# Patient Record
Sex: Male | Born: 1980 | Race: White | Hispanic: No | Marital: Single | Smoking: Current every day smoker
Health system: Southern US, Community
[De-identification: ages and names within clinical notes are randomized; demographics above are authoritative.]

## PROBLEM LIST (undated history)

## (undated) DIAGNOSIS — K219 Gastro-esophageal reflux disease without esophagitis: Secondary | ICD-10-CM

## (undated) DIAGNOSIS — Z8614 Personal history of Methicillin resistant Staphylococcus aureus infection: Secondary | ICD-10-CM

## (undated) DIAGNOSIS — K449 Diaphragmatic hernia without obstruction or gangrene: Secondary | ICD-10-CM

## (undated) HISTORY — PX: MANDIBLE SURGERY: SHX707

---

## 2002-10-26 ENCOUNTER — Encounter: Payer: Self-pay | Admitting: Emergency Medicine

## 2002-10-26 ENCOUNTER — Emergency Department (HOSPITAL_COMMUNITY): Admission: EM | Admit: 2002-10-26 | Discharge: 2002-10-26 | Payer: Self-pay | Admitting: Emergency Medicine

## 2002-12-28 ENCOUNTER — Encounter: Payer: Self-pay | Admitting: Emergency Medicine

## 2002-12-28 ENCOUNTER — Emergency Department (HOSPITAL_COMMUNITY): Admission: EM | Admit: 2002-12-28 | Discharge: 2002-12-28 | Payer: Self-pay | Admitting: Emergency Medicine

## 2004-05-08 ENCOUNTER — Emergency Department (HOSPITAL_COMMUNITY): Admission: EM | Admit: 2004-05-08 | Discharge: 2004-05-08 | Payer: Self-pay | Admitting: Emergency Medicine

## 2004-12-21 ENCOUNTER — Ambulatory Visit: Payer: Self-pay | Admitting: Family Medicine

## 2004-12-25 ENCOUNTER — Ambulatory Visit (HOSPITAL_COMMUNITY): Admission: RE | Admit: 2004-12-25 | Discharge: 2004-12-25 | Payer: Self-pay | Admitting: Internal Medicine

## 2005-01-01 ENCOUNTER — Ambulatory Visit: Payer: Self-pay | Admitting: Family Medicine

## 2005-01-03 ENCOUNTER — Ambulatory Visit (HOSPITAL_COMMUNITY): Admission: RE | Admit: 2005-01-03 | Discharge: 2005-01-03 | Payer: Self-pay | Admitting: Internal Medicine

## 2005-01-15 ENCOUNTER — Ambulatory Visit: Payer: Self-pay | Admitting: Family Medicine

## 2005-02-12 ENCOUNTER — Emergency Department (HOSPITAL_COMMUNITY): Admission: EM | Admit: 2005-02-12 | Discharge: 2005-02-12 | Payer: Self-pay | Admitting: Emergency Medicine

## 2006-04-19 ENCOUNTER — Emergency Department (HOSPITAL_COMMUNITY): Admission: EM | Admit: 2006-04-19 | Discharge: 2006-04-19 | Payer: Self-pay | Admitting: Emergency Medicine

## 2006-05-05 ENCOUNTER — Emergency Department (HOSPITAL_COMMUNITY): Admission: EM | Admit: 2006-05-05 | Discharge: 2006-05-05 | Payer: Self-pay | Admitting: Emergency Medicine

## 2006-05-07 ENCOUNTER — Ambulatory Visit (HOSPITAL_BASED_OUTPATIENT_CLINIC_OR_DEPARTMENT_OTHER): Admission: RE | Admit: 2006-05-07 | Discharge: 2006-05-07 | Payer: Self-pay | Admitting: Otolaryngology

## 2006-05-22 ENCOUNTER — Ambulatory Visit (HOSPITAL_COMMUNITY): Admission: RE | Admit: 2006-05-22 | Discharge: 2006-05-22 | Payer: Self-pay | Admitting: Otolaryngology

## 2006-05-23 ENCOUNTER — Ambulatory Visit (HOSPITAL_BASED_OUTPATIENT_CLINIC_OR_DEPARTMENT_OTHER): Admission: RE | Admit: 2006-05-23 | Discharge: 2006-05-23 | Payer: Self-pay | Admitting: Otolaryngology

## 2006-06-14 ENCOUNTER — Ambulatory Visit (HOSPITAL_BASED_OUTPATIENT_CLINIC_OR_DEPARTMENT_OTHER): Admission: RE | Admit: 2006-06-14 | Discharge: 2006-06-14 | Payer: Self-pay | Admitting: Otolaryngology

## 2006-07-05 ENCOUNTER — Ambulatory Visit (HOSPITAL_BASED_OUTPATIENT_CLINIC_OR_DEPARTMENT_OTHER): Admission: RE | Admit: 2006-07-05 | Discharge: 2006-07-05 | Payer: Self-pay | Admitting: Otolaryngology

## 2006-10-02 ENCOUNTER — Emergency Department (HOSPITAL_COMMUNITY): Admission: EM | Admit: 2006-10-02 | Discharge: 2006-10-02 | Payer: Self-pay | Admitting: Emergency Medicine

## 2008-10-16 ENCOUNTER — Emergency Department (HOSPITAL_COMMUNITY): Admission: EM | Admit: 2008-10-16 | Discharge: 2008-10-16 | Payer: Self-pay | Admitting: Emergency Medicine

## 2011-03-30 NOTE — Op Note (Signed)
[name], [name]               ACCOUNT NO.:  [id number]   MEDICAL RECORD NO.:  [id number]          PATIENT TYPE:  AMB   LOCATION:  DSC                          FACILITY:  [facility]   PHYSICIAN:  Lucky Cowboy, MD         DATE OF BIRTH:  1981/08/31   DATE OF PROCEDURE:  06/14/2006  DATE OF DISCHARGE:  06/14/2006                                 OPERATIVE REPORT   PREOPERATIVE DIAGNOSIS:  Mandible fracture.   POSTOPERATIVE DIAGNOSIS:  Mandible fracture.   PROCEDURE:  Maxillomandibular fixation.   SURGEON:  Lucky Cowboy, MD   ANESTHESIA:  General.   ESTIMATED BLOOD LOSS:  Less than 20 cc.   SPECIMENS:  None.   COMPLICATIONS:  None.   INDICATIONS:  The patient is a 30 year old male who underwent  maxillomandibular fixation and open reduction internal fixation of mandible  fractures; approximately 2 nights and 3 weeks ago.  He did relatively well  initially, but reports he developed nausea and was concerned about vomiting.  For this reason, he cut himself out of occlusion.  He is returned today for  replacement of maxillomandibular fixation.   FINDINGS:  The patient was noted to require tightening of the bolt; however,  he went into occlusion __________ .   PROCEDURE:  The patient was taken to the operating room and placed on the  table in the supine position.  He was then transnasally fiberoptically  intubated.  Once he was put to sleep, 1% lidocaine with 1:100,000 of  epinephrine was used to inject the area around the existing fore bolt.  Bovie cautery was used to open the tissue overlying the bolt, which had been  closed over.  A screwdriver was used to tighten down the bolt.  A 22 gauge  wire was then used to form a crisscross pattern, which held the patient in  class I occlusion.  The molars were occluding along their wire facet.  An NG  tube was used to suction the gastric contents.  The patient was awakened  from the anesthesia and taken to the Post Anesthesia Care Unit in  stable  condition.  There were no complications.      Lucky Cowboy, MD  Electronically Signed     SJ/MEDQ  D:  08/08/2006  T:  08/10/2006  Job:  [id number]
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
## 2011-03-30 NOTE — Op Note (Signed)
[name], Roberto Myers               ACCOUNT NO.:  [id number]   MEDICAL RECORD NO.:  [id number]          PATIENT TYPE:  AMB   LOCATION:  DSC                          FACILITY:  [facility]   PHYSICIAN:  Lucky Cowboy, MD         DATE OF BIRTH:  1980-12-22   DATE OF PROCEDURE:  05/23/2006  DATE OF DISCHARGE:                                 OPERATIVE REPORT   PREOPERATIVE DIAGNOSIS:  Left angle mandibular fracture, nondisplaced.   POSTOPERATIVE DIAGNOSIS:  Left angle mandibular fracture, nondisplaced.   PROCEDURE:  Replacement of maxillomandibular wires.   SURGEON:  Dr. Lucky Cowboy.   ANESTHESIA:  General endotracheal anesthesia.   ESTIMATED BLOOD LOSS:  Less than 20 mL.   SPECIMENS:  None.   COMPLICATIONS:  None.   INDICATIONS:  The patient is a 30 year old male underwent placement of  maxillomandibular fixation for closed reduction of a nondisplaced left  mandibular fracture about two and a half weeks ago.  Four days ago, he  experiences nausea and was taken anticipating vomiting and for this reason,  he cut his wires.  They have to be replaced today.  The patient did have a  Panorex yesterday which revealed excellent alignment of the fracture.   PROCEDURE:  The patient was taken to the operating room and placed on the  table in the supine position.  He was then placed under general anesthesia  through nasotracheal intubation.  1% lidocaine with 1:100,000 epinephrine  was used to inject the in the gingiva and upper lip along mucosal services  in the area of the four bolt system.  The cut wires were removed.  The Bovie  was used on the cutting mode to expose the bolts.  They were screwed down  close to the skin and were all four stable after doing this maneuver.  22-  gauge wires were then placed in a crisscross fashion to connect two bolts  together.  This resulted in excellent stabilization and class 2 occlusion  with contact of all the wear facets.  The incisions were closed in a  running  locking stitch using 4-0 Vicryl.  Bone wax was placed on the wires.  The  patient was awakened from anesthesia and extubated in the operating room.  He was taken to the Post Anesthesia Care Unit in stable condition.  There  were no complications.      Lucky Cowboy, MD  Electronically Signed     SJ/MEDQ  D:  05/23/2006  T:  05/23/2006  Job:  [id number]
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
## 2011-08-17 LAB — CBC
Hemoglobin: 14.8 g/dL (ref 13.0–17.0)
MCV: 95.9 fL (ref 78.0–100.0)
Platelets: 265 10*3/uL (ref 150–400)
RBC: 4.76 MIL/uL (ref 4.22–5.81)
RDW: 13 % (ref 11.5–15.5)

## 2011-08-17 LAB — RAPID URINE DRUG SCREEN, HOSP PERFORMED
Amphetamines: NOT DETECTED
Barbiturates: NOT DETECTED
Opiates: POSITIVE — AB
Tetrahydrocannabinol: POSITIVE — AB

## 2011-08-17 LAB — BASIC METABOLIC PANEL
Chloride: 102 mEq/L (ref 96–112)
GFR calc Af Amer: >60 mL/min (ref >60)
Glucose, Bld: 88 mg/dL (ref 70–99)
Potassium: 4 mEq/L (ref 3.5–5.1)

## 2011-08-17 LAB — DIFFERENTIAL
Basophils Absolute: 0.1 10*3/uL (ref 0.0–0.1)
Basophils Relative: 1 % (ref 0–1)
Eosinophils Absolute: 1.6 10*3/uL — ABNORMAL HIGH (ref 0.0–0.7)
Eosinophils Relative: 13 % — ABNORMAL HIGH (ref 0–5)
Lymphocytes Relative: 11 % — ABNORMAL LOW (ref 12–46)
Lymphs Abs: 1.3 10*3/uL (ref 0.7–4.0)
Monocytes Absolute: 0.6 10*3/uL (ref 0.1–1.0)

## 2011-08-17 LAB — ETHANOL: Alcohol, Ethyl (B): <5 mg/dL (ref 0–10)

## 2014-03-15 ENCOUNTER — Encounter (HOSPITAL_BASED_OUTPATIENT_CLINIC_OR_DEPARTMENT_OTHER): Payer: Self-pay | Admitting: Emergency Medicine

## 2014-03-15 ENCOUNTER — Emergency Department (HOSPITAL_BASED_OUTPATIENT_CLINIC_OR_DEPARTMENT_OTHER): Payer: BC Managed Care – PPO

## 2014-03-15 ENCOUNTER — Emergency Department (HOSPITAL_BASED_OUTPATIENT_CLINIC_OR_DEPARTMENT_OTHER)
Admission: EM | Admit: 2014-03-15 | Discharge: 2014-03-15 | Disposition: A | Discharge status: Home / Self Care | Payer: BC Managed Care – PPO | Attending: Emergency Medicine | Admitting: Emergency Medicine

## 2014-03-15 DIAGNOSIS — M549 Dorsalgia, unspecified: Secondary | ICD-10-CM | POA: Insufficient documentation

## 2014-03-15 DIAGNOSIS — R109 Unspecified abdominal pain: Secondary | ICD-10-CM

## 2014-03-15 DIAGNOSIS — K7689 Other specified diseases of liver: Secondary | ICD-10-CM | POA: Insufficient documentation

## 2014-03-15 DIAGNOSIS — R1012 Left upper quadrant pain: Secondary | ICD-10-CM | POA: Insufficient documentation

## 2014-03-15 DIAGNOSIS — Z8614 Personal history of Methicillin resistant Staphylococcus aureus infection: Secondary | ICD-10-CM | POA: Insufficient documentation

## 2014-03-15 DIAGNOSIS — R1032 Left lower quadrant pain: Secondary | ICD-10-CM | POA: Insufficient documentation

## 2014-03-15 DIAGNOSIS — K769 Liver disease, unspecified: Secondary | ICD-10-CM

## 2014-03-15 HISTORY — DX: Personal history of Methicillin resistant Staphylococcus aureus infection: Z86.14

## 2014-03-15 HISTORY — DX: Diaphragmatic hernia without obstruction or gangrene: K44.9

## 2014-03-15 HISTORY — DX: Gastro-esophageal reflux disease without esophagitis: K21.9

## 2014-03-15 LAB — URINALYSIS, ROUTINE W REFLEX MICROSCOPIC
BILIRUBIN URINE: NEGATIVE
GLUCOSE, UA: NEGATIVE mg/dL
HGB URINE DIPSTICK: NEGATIVE
Ketones, ur: NEGATIVE mg/dL
Leukocytes, UA: NEGATIVE
Nitrite: NEGATIVE
Protein, ur: NEGATIVE mg/dL
SPECIFIC GRAVITY, URINE: 1.021 (ref 1.005–1.030)
Urobilinogen, UA: 0.2 mg/dL (ref 0.0–1.0)
pH: 5.5 (ref 5.0–8.0)

## 2014-03-15 LAB — COMPREHENSIVE METABOLIC PANEL
ALBUMIN: 4.6 g/dL (ref 3.5–5.2)
ALT: 30 U/L (ref 0–53)
AST: 25 U/L (ref 0–37)
Alkaline Phosphatase: 56 U/L (ref 39–117)
BILIRUBIN TOTAL: 0.3 mg/dL (ref 0.3–1.2)
BUN: 18 mg/dL (ref 6–23)
CALCIUM: 9.4 mg/dL (ref 8.4–10.5)
CHLORIDE: 101 meq/L (ref 96–112)
CO2: 21 meq/L (ref 19–32)
Creatinine, Ser: 1.1 mg/dL (ref 0.50–1.35)
GFR calc Af Amer: >90 mL/min (ref >90)
GFR calc non Af Amer: 86 mL/min — ABNORMAL LOW (ref >90)
GLUCOSE: 103 mg/dL — AB (ref 70–99)
POTASSIUM: 3.8 meq/L (ref 3.7–5.3)
SODIUM: 140 meq/L (ref 137–147)
TOTAL PROTEIN: 7.6 g/dL (ref 6.0–8.3)

## 2014-03-15 LAB — CBC WITH DIFFERENTIAL/PLATELET
BASOS PCT: 1 % (ref 0–1)
Basophils Absolute: 0.1 10*3/uL (ref 0.0–0.1)
EOS ABS: 2.2 10*3/uL — AB (ref 0.0–0.7)
EOS PCT: 22 % — AB (ref 0–5)
HCT: 44.9 % (ref 39.0–52.0)
HEMOGLOBIN: 15.8 g/dL (ref 13.0–17.0)
LYMPHS ABS: 2.9 10*3/uL (ref 0.7–4.0)
Lymphocytes Relative: 30 % (ref 12–46)
MCH: 32.8 pg (ref 26.0–34.0)
MCHC: 35.2 g/dL (ref 30.0–36.0)
MCV: 93.3 fL (ref 78.0–100.0)
Monocytes Absolute: 0.6 10*3/uL (ref 0.1–1.0)
Monocytes Relative: 6 % (ref 3–12)
NEUTROS ABS: 4 10*3/uL (ref 1.7–7.7)
NEUTROS PCT: 41 % — AB (ref 43–77)
Platelets: 272 10*3/uL (ref 150–400)
RBC: 4.81 MIL/uL (ref 4.22–5.81)
RDW: 12.7 % (ref 11.5–15.5)
WBC: 9.7 10*3/uL (ref 4.0–10.5)

## 2014-03-15 LAB — LIPASE, BLOOD: Lipase: 21 U/L (ref 11–59)

## 2014-03-15 MED ORDER — HYDROCODONE-ACETAMINOPHEN 5-325 MG PO TABS: 1.0000 | ORAL_TABLET | ORAL | Status: AC | PRN

## 2014-03-15 MED ORDER — IOHEXOL 300 MG/ML  SOLN
50.0000 mL | Freq: Once | INTRAMUSCULAR | Status: AC | PRN
  Administered 2014-03-15: 50 mL via ORAL

## 2014-03-15 MED ORDER — IOHEXOL 300 MG/ML  SOLN
100.0000 mL | Freq: Once | INTRAMUSCULAR | Status: AC | PRN
  Administered 2014-03-15: 100 mL via INTRAVENOUS

## 2014-03-15 MED ORDER — ONDANSETRON HCL 4 MG/2ML IJ SOLN
4.0000 mg | Freq: Once | INTRAMUSCULAR | Status: AC
  Administered 2014-03-15: 4 mg via INTRAVENOUS
  Filled 2014-03-15: qty 2

## 2014-03-15 MED ORDER — MORPHINE SULFATE 4 MG/ML IJ SOLN
4.0000 mg | Freq: Once | INTRAMUSCULAR | Status: AC
  Administered 2014-03-15: 4 mg via INTRAVENOUS
  Filled 2014-03-15: qty 1

## 2014-03-15 NOTE — ED Provider Notes (Signed)
CSN: [id number]     Arrival date & time 03/15/14  1138 History   First MD Initiated Contact with Patient 03/15/14 1140     Chief Complaint  Patient presents with  . Abdominal Pain  . Back Pain     (Consider location/radiation/quality/duration/timing/severity/associated sxs/prior Treatment) HPI Comments: Pt states that he has been having change in the texture of stool no diarrhea or bloody stools. No fever. Pt states that the pain is constant but at times it gets more intense  Patient is a 33 y.o. male presenting with abdominal pain and back pain. The history is provided by the patient.  Abdominal Pain Pain location:  L flank and LLQ Pain quality: aching and pressure   Pain severity:  Moderate Onset quality:  Gradual Duration:  2 weeks Timing:  Constant Progression:  Unchanged Context: not alcohol use, not previous surgeries and not retching   Worsened by:  Nothing tried Back Pain Associated symptoms: abdominal pain     Past Medical History  Diagnosis Date  . Hx MRSA infection    No past surgical history on file. No family history on file. History  Substance Use Topics  . Smoking status: Not on file  . Smokeless tobacco: Not on file  . Alcohol Use: Not on file    Review of Systems  Gastrointestinal: Positive for abdominal pain.  Musculoskeletal: Positive for back pain.      Allergies  Review of patient's allergies indicates not on file.  Home Medications   Prior to Admission medications   Not on File   BP 145/96  Pulse 106  Temp(Src) 98.7 F (37.1 C) (Oral)  Resp 20  Ht 5\' 10"  (1.778 m)  Wt 195 lb (88.451 kg)  BMI 27.98 kg/m2  SpO2 99% Physical Exam  Nursing note and vitals reviewed. Constitutional: He is oriented to person, place, and time.  Cardiovascular: Normal rate and regular rhythm.   Pulmonary/Chest: Effort normal and breath sounds normal.  Abdominal: Soft. There is tenderness in the left upper quadrant and left lower quadrant.   Musculoskeletal: Normal range of motion.  Neurological: He is alert and oriented to person, place, and time. Coordination normal.  Skin: Skin is warm and dry.    ED Course  Procedures (including critical care time) Labs Review Labs Reviewed  COMPREHENSIVE METABOLIC PANEL - Abnormal; Notable for the following:    Glucose, Bld 103 (*)    GFR calc non Af Amer 86 (*)    All other components within normal limits  CBC WITH DIFFERENTIAL - Abnormal; Notable for the following:    Neutrophils Relative % 41 (*)    Eosinophils Relative 22 (*)    Eosinophils Absolute 2.2 (*)    All other components within normal limits  LIPASE, BLOOD  URINALYSIS, ROUTINE W REFLEX MICROSCOPIC    Imaging Review Ct Abdomen Pelvis W Contrast  03/15/2014   CLINICAL DATA:  Left-sided abdominal pain, low back pain.  EXAM: CT ABDOMEN AND PELVIS WITH CONTRAST  TECHNIQUE: Multidetector CT imaging of the abdomen and pelvis was performed using the standard protocol following bolus administration of intravenous contrast.  CONTRAST:  50mL OMNIPAQUE IOHEXOL 300 MG/ML SOLN, 100mL OMNIPAQUE IOHEXOL 300 MG/ML SOLN  COMPARISON:  CT ABDOMEN W/O CM dated 12/25/2004  FINDINGS: Lung bases show no acute findings. Heart size normal. No pericardial or pleural effusion.  3 mm low-attenuation lesion in the right hepatic lobe is too small to characterize. Liver, gallbladder, adrenal glands, kidneys, spleen, pancreas, stomach and bowel are otherwise  unremarkable. Prostate is normal in size. There is a calcification in the soft tissues posterior to the symphysis pubis (series 2, image 85), measuring 4 mm, which appears more prominent than on 12/25/2004 and may represent a prostatic calcification. Bladder is grossly unremarkable. No pathologically enlarged lymph nodes. No free fluid. 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  IMPRESSION: 1. No findings to explain the patient's given symptoms. 2. Atherosclerotic  calcification of the arterial vasculature appears age advanced.   Electronically Signed   By: Leanna BattlesMelinda  Blietz M.D.   On: 03/15/2014 13:42     EKG Interpretation None      MDM   Final diagnoses:  Abdominal pain  Flank pain  Liver lesion, right lobe    No acute findings:discussed ct findings with pt and discussed the need to follow up on liver findings. Will send home with resource guide and pain medication    Teressa LowerVrinda Jilliana Burkes, NP 03/15/14 1408

## 2014-03-15 NOTE — ED Provider Notes (Signed)
Medical screening examination/treatment/procedure(s) were performed by non-physician practitioner and as supervising physician I was immediately available for consultation/collaboration.     Estephan Gallardo, MD 03/15/14 1423 

## 2014-03-15 NOTE — ED Notes (Signed)
Pt. Stated pain in LUQ that radiates to L flank x2 weeks.

## 2014-03-15 NOTE — Discharge Instructions (Signed)
As discussed you need to follow up with your doctor to evaluate the liver further Abdominal Pain, Adult Many things can cause belly (abdominal) pain. Most times, the belly pain is not dangerous. Many cases of belly pain can be watched and treated at home. HOME CARE   Do not take medicines that help you go poop (laxatives) unless told to by your doctor.  Only take medicine as told by your doctor.  Eat or drink as told by your doctor. Your doctor will tell you if you should be on a special diet. GET HELP IF:  You do not know what is causing your belly pain.  You have belly pain while you are sick to your stomach (nauseous) or have runny poop (diarrhea).  You have pain while you pee or poop.  Your belly pain wakes you up at night.  You have belly pain that gets worse or better when you eat.  You have belly pain that gets worse when you eat fatty foods. GET HELP RIGHT AWAY IF:   The pain does not go away within 2 hours.  You have a fever.  You keep throwing up (vomiting).  The pain changes and is only in the right or left part of the belly.  You have bloody or tarry looking poop. MAKE SURE YOU:   Understand these instructions.  Will watch your condition.  Will get help right away if you are not doing well or get worse. Document Released: 04/16/2008 Document Revised: 08/19/2013 Document Reviewed: 07/08/2013 [facility] Patient Information 2014 [employer], [place].  Emergency Department Resource Guide 1) Find a Doctor and Pay Out of Pocket Although you won't have to find out who is covered by your insurance plan, it is a good idea to ask around and get recommendations. You will then need to call the office and see if the doctor you have chosen will accept you as a new patient and what types of options they offer for patients who are self-pay. Some doctors offer discounts or will set up payment plans for their patients who do not have insurance, but you will need to ask so you aren't  surprised when you get to your appointment.  2) Contact Your Local Health Department Not all health departments have doctors that can see patients for sick visits, but many do, so it is worth a call to see if yours does. If you don't know where your local health department is, you can check in your phone book. The CDC also has a tool to help you locate your state's health department, and many state websites also have listings of all of their local health departments.  3) Find a Walk-in Clinic If your illness is not likely to be very severe or complicated, you may want to try a walk in clinic. These are popping up all over the country in pharmacies, drugstores, and shopping centers. They're usually staffed by nurse practitioners or physician assistants that have been trained to treat common illnesses and complaints. They're usually fairly quick and inexpensive. However, if you have serious medical issues or chronic medical problems, these are probably not your best option.  No Primary Care Doctor: - Call Health Connect at  [phone] - they can help you locate a primary care doctor that  accepts your insurance, provides certain services, etc. - Physician Referral Service- [phone]  Chronic Pain Problems: Organization         Address  Phone   Notes  [facility]  [phone]  [phone] Patients need to be referred by their primary care doctor.   Medication Assistance: Organization         Address  Phone   Notes  [facility] [address] [place], [place] [address] [phone] --Must be a resident of [facility] [place] -- Must have NO insurance coverage whatsoever (no Medicaid/ Medicare, etc.) -- The pt. MUST have a primary care doctor that directs their care regularly and follows them in the community   MedAssist  [phone]   [employer]  [phone]) [phone]    Agencies that provide inexpensive medical care: Organization          Address  Phone   Notes  [facility]  [phone]   [facility]    [phone]) [phone]   [facility] [address] [place], [place] [address] [phone]) [phone]   [facility] [address], [place] [phone]) [phone]   [facility]anned Parenthood    [phone](336) [phone]   [facility]    [phone]) [phone]   [facility]  [address], [place] Phone:  [phone]) [phone], Fax:  [phone] Hours of Operation:  9 am - 6 pm, M-F.  Also accepts Medicaid/Medicare and self-pay.  [facility]  [address], [place] Phone: [phone]) [phone], Fax: [phone]. Hours of Operation:  8:30 am - 5:30 pm, M-F.  Also accepts Medicaid and self-pay.  [facility] [place] [address], [place] Phone: [phone]) [phone]   [facility] [address], [place], [place] [phone], Ext. [phone] Mondays & Thursdays: 7-9 AM.  First 15 patients are seen on a first come, first serve basis.    Medicaid-accepting [facility] Providers:  Organization         Address  Phone   Notes  [facility] [address], [place] [phone] Also accepts self-pay patients.  [facility] [address], [place]  [phone]) [phone]   [facility] [address], [place] [phone]) [phone]   [facility] [address] [place] [address], [place] [phone]) [phone]   Renaye RakersVeita Bland [address], [place]   [phone] Only accepts [place] Access [place] patients after they have their name applied to their card.   Self-Pay (no insurance) in [facility] [place]:  Organization         Address  Phone   Notes  Sickle Cell Patients, [facility] [address], [place] [phone]) [phone]   [facility] [address], [place] [phone]     [facility]  [address], [place] [phone]   [facility]/Dr. Osei-Bonsu  [address] [place] [address], [place] or [id number], [place] [phone] Phone number for both [place] and [place] locations is the same.  [facility] [address], [place] [phone]) [phone]   [facility] [place] [address] [place] [address], [place] or [address] [phone]   [facility] [address], [place] [phone]) [phone], phone; [phone](336) [phone], fax Sees patients 1st and 3rd Saturday of every month.  Must not qualify for public or private insurance (i.e. Medicaid, Medicare, [place] Health Choice, Veterans'  Benefits)  Household income should be no more than 200% of the poverty level The clinic cannot treat you if you are pregnant or think you are pregnant  Sexually transmitted diseases are not treated at the clinic.    Dental Care: Organization         Address  Phone  Notes  [facility] [address] [place], [place] [phone] Accepts children up to age 62 who are enrolled in [place] or [place] Health Choice; pregnant women with a Medicaid card; and children who have applied for Medicaid or [place] Health Choice, but were declined, whose parents can pay a reduced fee at time of service.  [facility]  [address], [place] [phone] Accepts children up to age 22 who are enrolled in [place] or [place] Health Choice; pregnant women with a Medicaid card; and children who have applied for Medicaid or [place] Health Choice, but were declined, whose parents can pay a reduced fee at time of service.  [facility]  [address] [place], [place] [phone] Patients are seen by appointment only. Walk-ins are not accepted. [facility] will see patients 57  years of age and older. Monday - Tuesday (8am-5pm) Most Wednesdays (8:30-5pm) $30 per visit, cash only  [facility] Adult Dental Access PROGRAM  [address], [facility] [phone] Patients are seen by appointment only. Walk-ins are not accepted. [facility] will see patients 27 years of age and older. One Wednesday Evening (Monthly: Volunteer Based).  $30 per visit, cash only  [employer]  [phone] for adults; Children under age 33, call Graduate Pediatric Dentistry at [phone]. Children aged 71-14, please call [phone] to request a pediatric application.  Dental services are provided in all areas of dental care including fillings, crowns and bridges, complete and partial dentures, implants, gum treatment, root canals, and extractions. Preventive care is also provided. Treatment is provided to both adults and children. Patients are selected via a lottery and there is often a waiting list.   [facility] [address] [place]  [phone] [email or web address]   [facility] [address] [place], [place] [phone], Ext. [phone] Second and Fourth Thursday of each month, opens at 6:30 AM; Clinic ends at 9 AM.  Patients are seen on a first-come first-served basis, and a limited number are seen during each clinic.   [facility]  [address] [place], [place] [phone]   Eligibility Requirements You must have lived in [place], [place], or [place] counties for at least the last three months.   You cannot be eligible for state or federal sponsored [place], including [employer], [place], or [employer].   You generally cannot be eligible for healthcare insurance through your employer.    How to apply: Eligibility screenings are held every Tuesday and Wednesday afternoon from 1:00 pm until 4:00 pm. You do not need an appointment for the interview!  [facility]  [address] [place], [place] [phone]   [facility] Health Department  [phone]   [facility] Health Department  [phone]   [facility] Health Department  [phone]    Behavioral Health Resources in the Community: Intensive Outpatient Programs Organization         Address  Phone  Notes  [facility] [address],  [place], [place] [phone]   [facility] [address] [place], [place] [phone]   [facility] [address] [place], [place]  [phone]   [facility] [address]  [place], [place] [phone] or [phone]   Substance Abuse Resources Organization         Address  Phone  Notes  [facility] Drug Services  [phone]   [facility]  [phone]   [facility] [place]  [phone]   Floydene Flock  [phone]   Residential & Outpatient Substance Abuse Program  [phone]   Psychological Services Organization         Address  Phone  Notes  [facility]  [phone]   [facility]  [phone]   [facility] [address], [place] [phone] or [phone]    Mobile Crisis Teams Organization         Address  Phone  Notes  [facility], Mobile Crisis Care Unit  [phone]   [facility]  [address] [place], [place] [phone]   Doristine Locks [address] [place] [phone]    Self-Help/Support Groups Organization         Address  Phone             Notes  Mental Health Assoc. of [place] - variety of support groups  [phone]- [phone] Call for more information  Narcotics Anonymous (NA), [facility] [address], [employer] [place]  2 meetings at this location   Statistician         Address  Phone  Notes  [facility] [address],    [place]   [phone]   [facility]  [address] [place] [address]242, [place], [place] [phone]   [facility] [address] [place], [place] [phone] Admissions: 8am-3pm M-F  [facility] [address]    [place], [place] [phone]   [facility] [address] [place], [place], [place] [phone]   [facility] [address]  [place], [place] [phone]   [facility] - Intensive Outpatient [address], [place], [place] [phone]   [facility] [address] [place].,  [place], [place] [phone] or [phone]   [facility] ([facility]) [address], [place], [place] [phone] Accepts Medicaid  [facility] [place] [address]  [place] [phone] Substance Abuse/Addiction Treatment   [facility] Organization         Address  Phone  Notes  [facility]  [phone]   Angie Fava, PhD [address] [place], [place]   [phone] or [phone]   [facility] Behavioral   [address] [place], [place] [phone]   [facility] [address] [place], [place] [phone] Insurance/Medicaid/sponsorship through [facility] Families [address]                                    [place], [place] [phone] Therapy/tele-psych/case  [facility] [address], [place] [phone]    Dr. Lolly Mustache  [phone]   [facility]  [employer] [facility] Dept. 1) [address], [place] 2) [phone]  [facility], [place] 3)  [address], [place] [phone]) [phone](336) [phone]  [phone](336) [phone]   [facility] Child Abuse Hotline [phone]) [phone] or [phone]) [phone] (After Hours)

## 2014-05-15 IMAGING — CT CT ABD-PELV W/ CM
2 of 4 series · 16 of 46 positions shown, 18 images · IV contrast (APPLIED)
Comparison: CT ABDOMEN W/O CM dated 12/25/2004

CLINICAL DATA: Left-sided abdominal pain, low back pain.

EXAM:
CT ABDOMEN AND PELVIS WITH CONTRAST
TECHNIQUE: Multidetector CT imaging of the abdomen and pelvis was performed
using the standard protocol following bolus administration of
intravenous contrast.
CONTRAST:  50mL OMNIPAQUE IOHEXOL 300 MG/ML SOLN, 100mL OMNIPAQUE
IOHEXOL 300 MG/ML SOLN

[Series 2: abd/pelvis 5.0 b31f · axial · 0.71mm/px · z∈[-476,-41]mm · 13 of 95 slices shown, 15 images]
[im 4/95  soft-tissue]
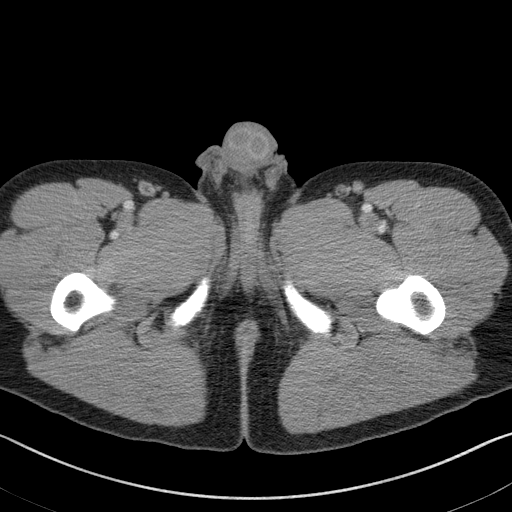
[im 4/95  bone]
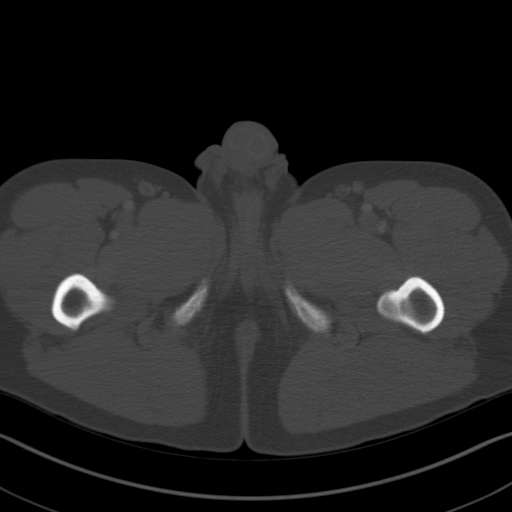
[im 12/95  soft-tissue]
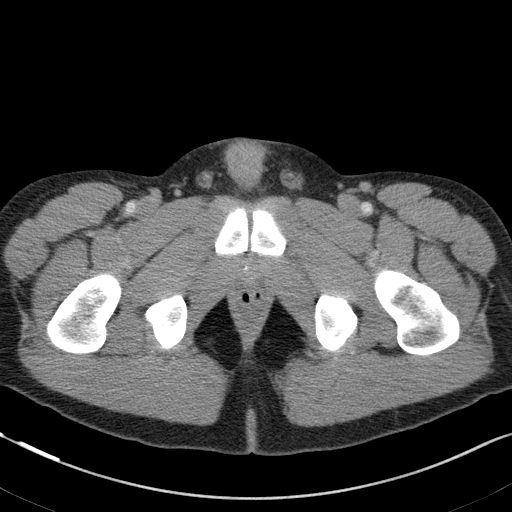
[im 19/95  soft-tissue]
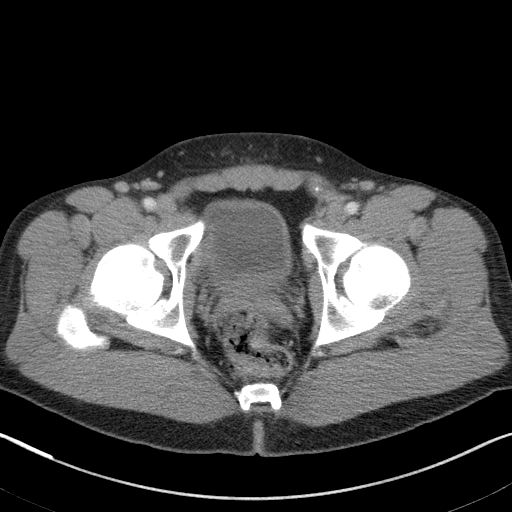
[im 27/95  soft-tissue]
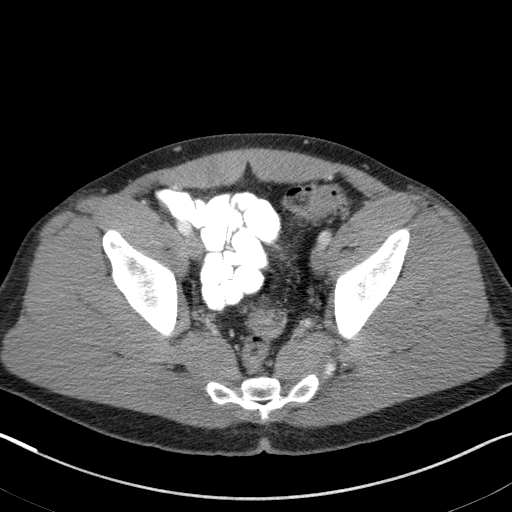
[im 34/95  soft-tissue]
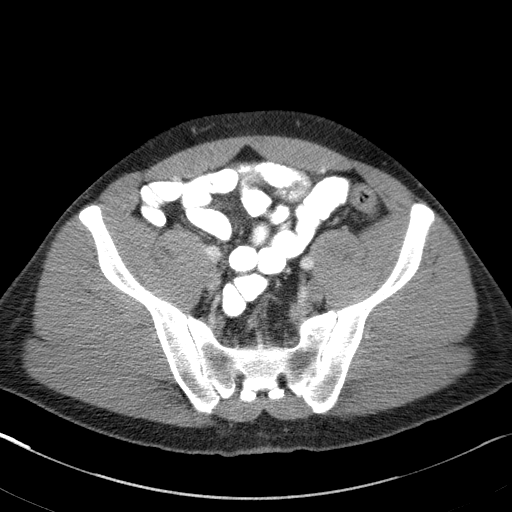
[im 42/95  soft-tissue]
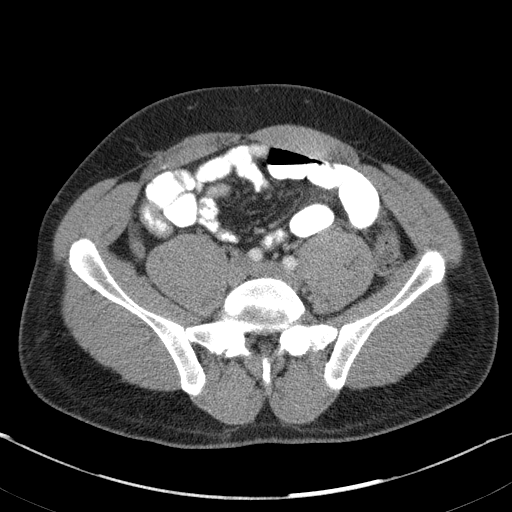
[im 49/95  soft-tissue]
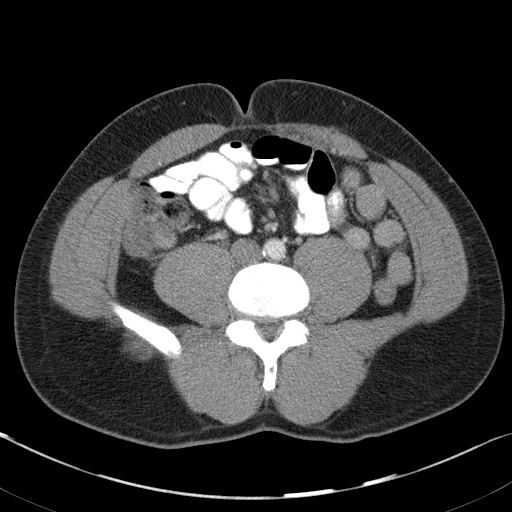
[im 53/95  soft-tissue]
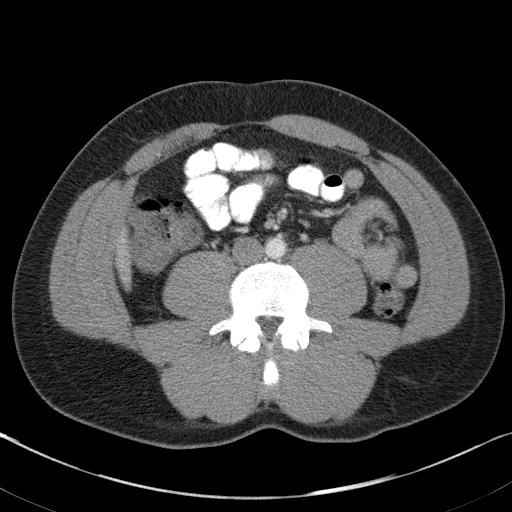
[im 61/95  soft-tissue]
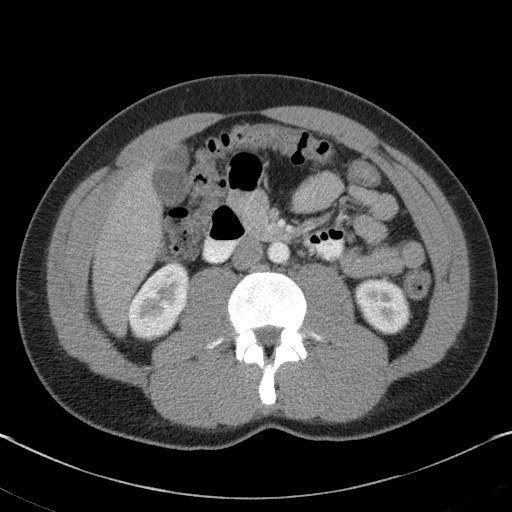
[im 61/95  bone]
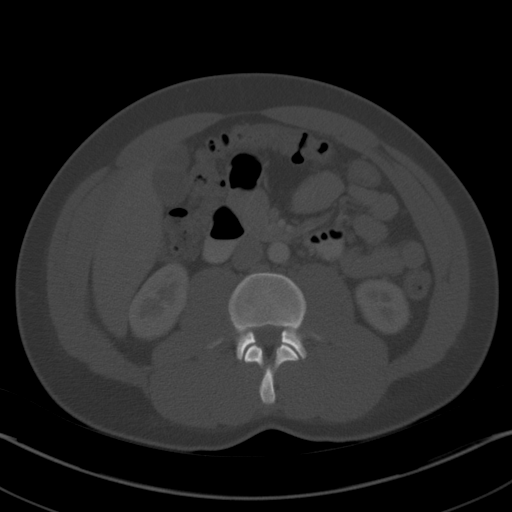
[im 68/95  soft-tissue]
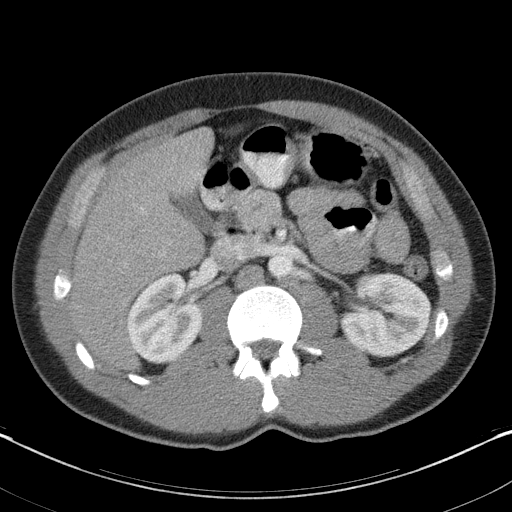
[im 76/95  soft-tissue]
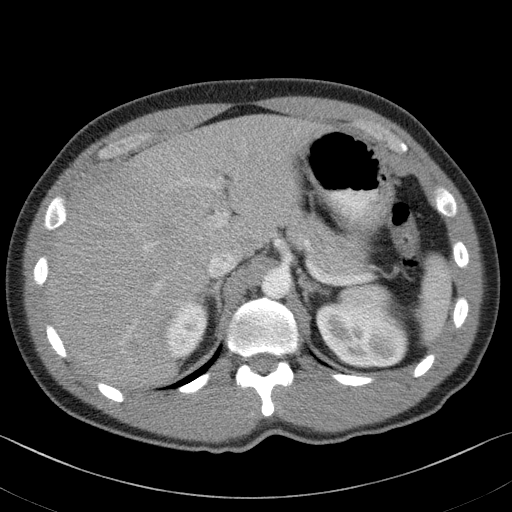
[im 83/95  soft-tissue]
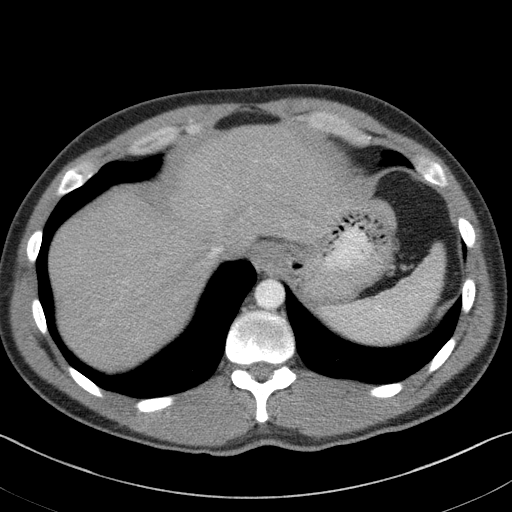
[im 91/95  soft-tissue]
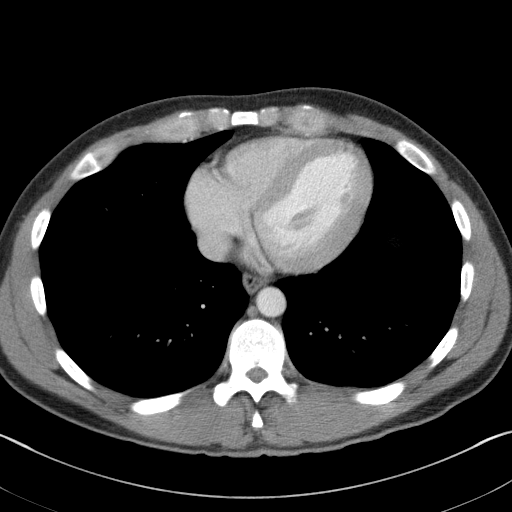

[Series 5: abd/pelvis 3.0 coronal · coronal · 0.73mm/px · 3 of 90 slices shown]
[im 30/90  soft-tissue]
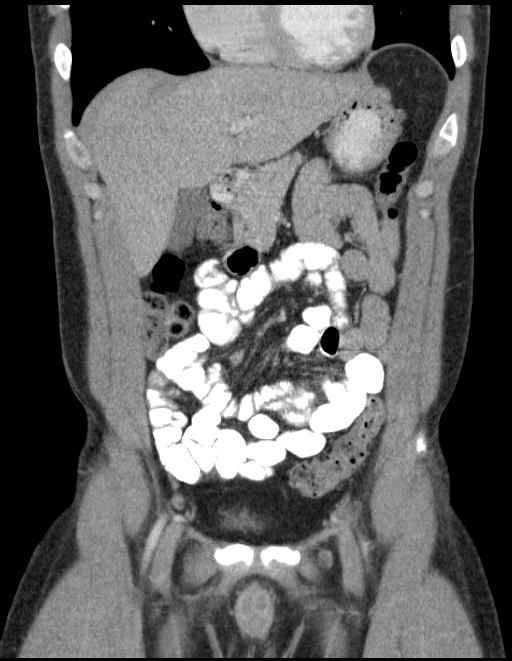
[im 40/90  soft-tissue]
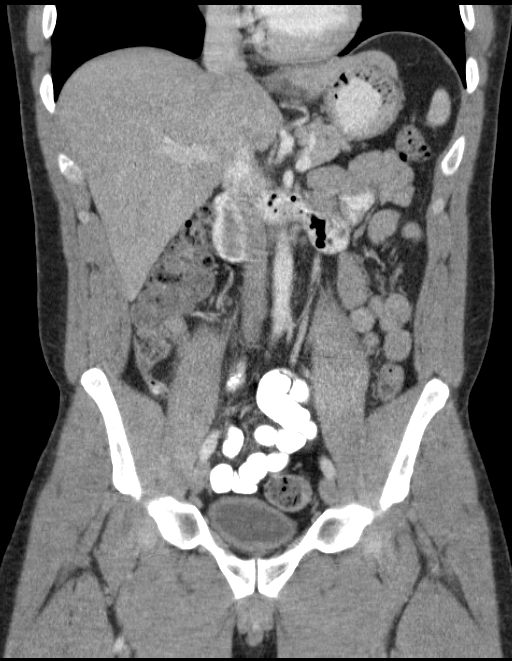
[im 50/90  soft-tissue]
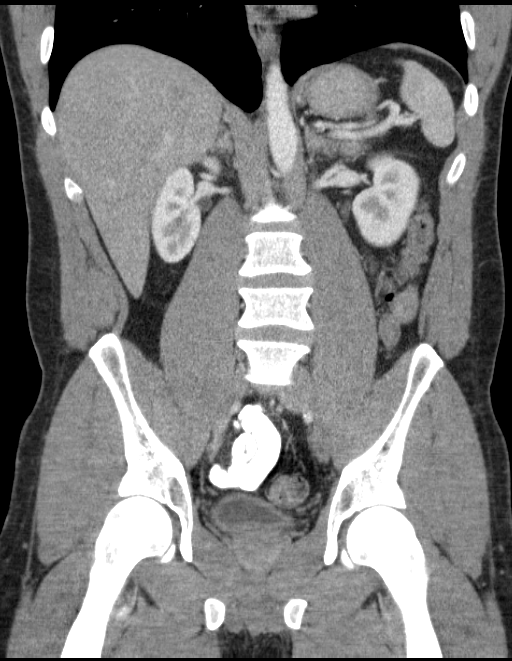

[16 of 46 positions shown; findings below may reference images not displayed]

FINDINGS: Lung bases show no acute findings. Heart size normal. No pericardial
or pleural effusion.

3 mm low-attenuation lesion in the right hepatic lobe is too small
to characterize. Liver, gallbladder, adrenal glands, kidneys,
spleen, pancreas, stomach and bowel are otherwise unremarkable.
Prostate is normal in size. There is a calcification in the soft
tissues posterior to the symphysis pubis (series 2, image 85),
measuring 4 mm, which appears more prominent than on 12/25/2004 and
may represent a prostatic calcification. Bladder is grossly
unremarkable. No pathologically enlarged lymph nodes. No free fluid.
Atherosclerotic calcification of the arterial vasculature appears
age advanced.
IMPRESSION: 1. No findings to explain the patient's given symptoms.
2. Atherosclerotic calcification of the arterial vasculature appears
age advanced.
